# Patient Record
Sex: Male | Born: 1990 | Race: Black or African American | Hispanic: No | Marital: Single | Smoking: Never smoker
Health system: Southern US, Community
[De-identification: ages and names within clinical notes are randomized; demographics above are authoritative.]

---
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
## 2014-01-08 ENCOUNTER — Other Ambulatory Visit: Payer: Self-pay | Admitting: Emergency Medicine

## 2014-01-08 ENCOUNTER — Ambulatory Visit
Admission: RE | Admit: 2014-01-08 | Discharge: 2014-01-08 | Disposition: A | Discharge status: Home / Self Care | Payer: 59 | Source: Ambulatory Visit | Attending: Emergency Medicine | Admitting: Emergency Medicine

## 2014-01-08 DIAGNOSIS — N63 Unspecified lump in unspecified breast: Secondary | ICD-10-CM

## 2016-02-21 ENCOUNTER — Encounter (HOSPITAL_COMMUNITY): Payer: Self-pay | Admitting: *Deleted

## 2016-02-21 ENCOUNTER — Emergency Department (HOSPITAL_COMMUNITY)
Admission: EM | Admit: 2016-02-21 | Discharge: 2016-02-21 | Disposition: A | Discharge status: Home / Self Care | Payer: 59 | Attending: Emergency Medicine | Admitting: Emergency Medicine

## 2016-02-21 ENCOUNTER — Emergency Department (HOSPITAL_COMMUNITY): Payer: 59

## 2016-02-21 DIAGNOSIS — R05 Cough: Secondary | ICD-10-CM | POA: Diagnosis present

## 2016-02-21 DIAGNOSIS — R042 Hemoptysis: Secondary | ICD-10-CM | POA: Insufficient documentation

## 2016-02-21 NOTE — ED Notes (Signed)
Declined W/C at D/C and was escorted to lobby by RN. 

## 2016-02-21 NOTE — ED Triage Notes (Signed)
PT reports when he coughs in the AM there is blood tinged mucous and when he blows his nose  There is blood tinged mucous.

## 2016-02-21 NOTE — Discharge Instructions (Signed)
Drink plenty of fluids You can use nasal saline drops to keep nose moist If symptoms are getting worse please return to ER

## 2016-02-21 NOTE — ED Provider Notes (Signed)
[facility]-EMERGENCY DEPT Provider Note   CSN: [id number] Arrival date & time: 02/21/16  1057  By signing my name below, I, Matthew Nielsen, attest that this documentation has been prepared under the direction and in the presence of Terance HartKelly Kashten Gowin, PA-C. Electronically Signed: Javier Dockerobert Ryan Halas, ER Scribe. 10/23/2015. 12:36 PM.  History   Chief Complaint Chief Complaint  Patient presents with  . Cough   HPI ucous  HPI Comments: Elveria RoyalsBobby Nielsen is a 25 y.o. male who presents to the Emergency Department complaining of clots of blood in his mucous when he coughs or blows his nose. He had several days of productive cough and congestion which has since resolved. He denies epistaxis. He does not smoke. He has only taken allergy pills since onset of his sx. He does not have a known bleeding disorder and is not on any blood thinners. Denies fever, chills, chest pain, URI symptoms, SOB, abdominal pain, N/V.   History reviewed. No pertinent past medical history.  There are no active problems to display for this patient.   History reviewed. No pertinent surgical history.    Home Medications    Prior to Admission medications   Not on File    Family History History reviewed. No pertinent family history.  Social History Social History  Substance Use Topics  . Smoking status: Never Smoker  . Smokeless tobacco: Never Used  . Alcohol use No     Allergies   Patient has no known allergies.   Review of Systems Review of Systems  Constitutional: Negative for chills and fever.  HENT: Negative for congestion, nosebleeds and rhinorrhea.   Respiratory: Positive for cough. Negative for shortness of breath and wheezing.   Cardiovascular: Negative for chest pain.  Gastrointestinal: Negative for nausea and vomiting.  All other systems reviewed and are negative.    Physical Exam Updated Vital Signs There were no vitals taken for this visit.  Physical Exam  Constitutional: He is oriented to  person, place, and time. He appears well-developed and well-nourished. No distress.  HENT:  Head: Normocephalic and atraumatic.  Right Ear: Hearing, tympanic membrane, external ear and ear canal normal.  Left Ear: Hearing, tympanic membrane, external ear and ear canal normal.  Nose: No epistaxis.  Mouth/Throat: Uvula is midline, oropharynx is clear and moist and mucous membranes are normal.  Eyes: Pupils are equal, round, and reactive to light.  Neck: Neck supple.  Cardiovascular: Normal rate and regular rhythm.  Exam reveals no gallop and no friction rub.   No murmur heard. Pulmonary/Chest: Effort normal and breath sounds normal. No respiratory distress. He has no wheezes. He has no rales. He exhibits no tenderness.  Musculoskeletal: Normal range of motion.  Neurological: He is alert and oriented to person, place, and time. Coordination normal.  Skin: Skin is warm and dry. He is not diaphoretic.  Psychiatric: He has a normal mood and affect. His behavior is normal.  Nursing note and vitals reviewed.    ED Treatments / Results  Labs (all labs ordered are listed, but only abnormal results are displayed) Labs Reviewed - No data to display  EKG  EKG Interpretation None       Radiology No results found.  Procedures Procedures (including critical care time)  Medications Ordered in ED Medications - No data to display   Initial Impression / Assessment and Plan / ED Course  I have reviewed the triage vital signs and the nursing notes.  Pertinent labs & imaging results that were available during  my care of the patient were reviewed by me and considered in my medical decision making (see chart for details).  Clinical Course    Patient has blood tinged mucous most likely due to prolonged coughing and blowing his nose. CXR negative. No evidence of bleeding on exam. Advised nasal saline drops and OTC cough medicine. Discussed return precautions.  Pt is hemodynamically stable & in  NAD prior to discharge.   Final Clinical Impressions(s) / ED Diagnoses   Final diagnoses:  Cough with hemoptysis    New Prescriptions There are no discharge medications for this patient.   I personally performed the services described in this documentation, which was scribed in my presence. The recorded information has been reviewed and is accurate.         Bethel BornKelly Marie Alechia Lezama, PA-C 02/26/16 [id number]    Loren Raceravid Yelverton, MD 02/26/16 [phone]

## 2017-07-30 IMAGING — DX DG CHEST 2V
2 series · 2 of 2 positions shown · non-contrast
Comparison: None.

CLINICAL DATA: Hemoptysis for 2 days.

EXAM:
CHEST  2 VIEW

[w chest pa]
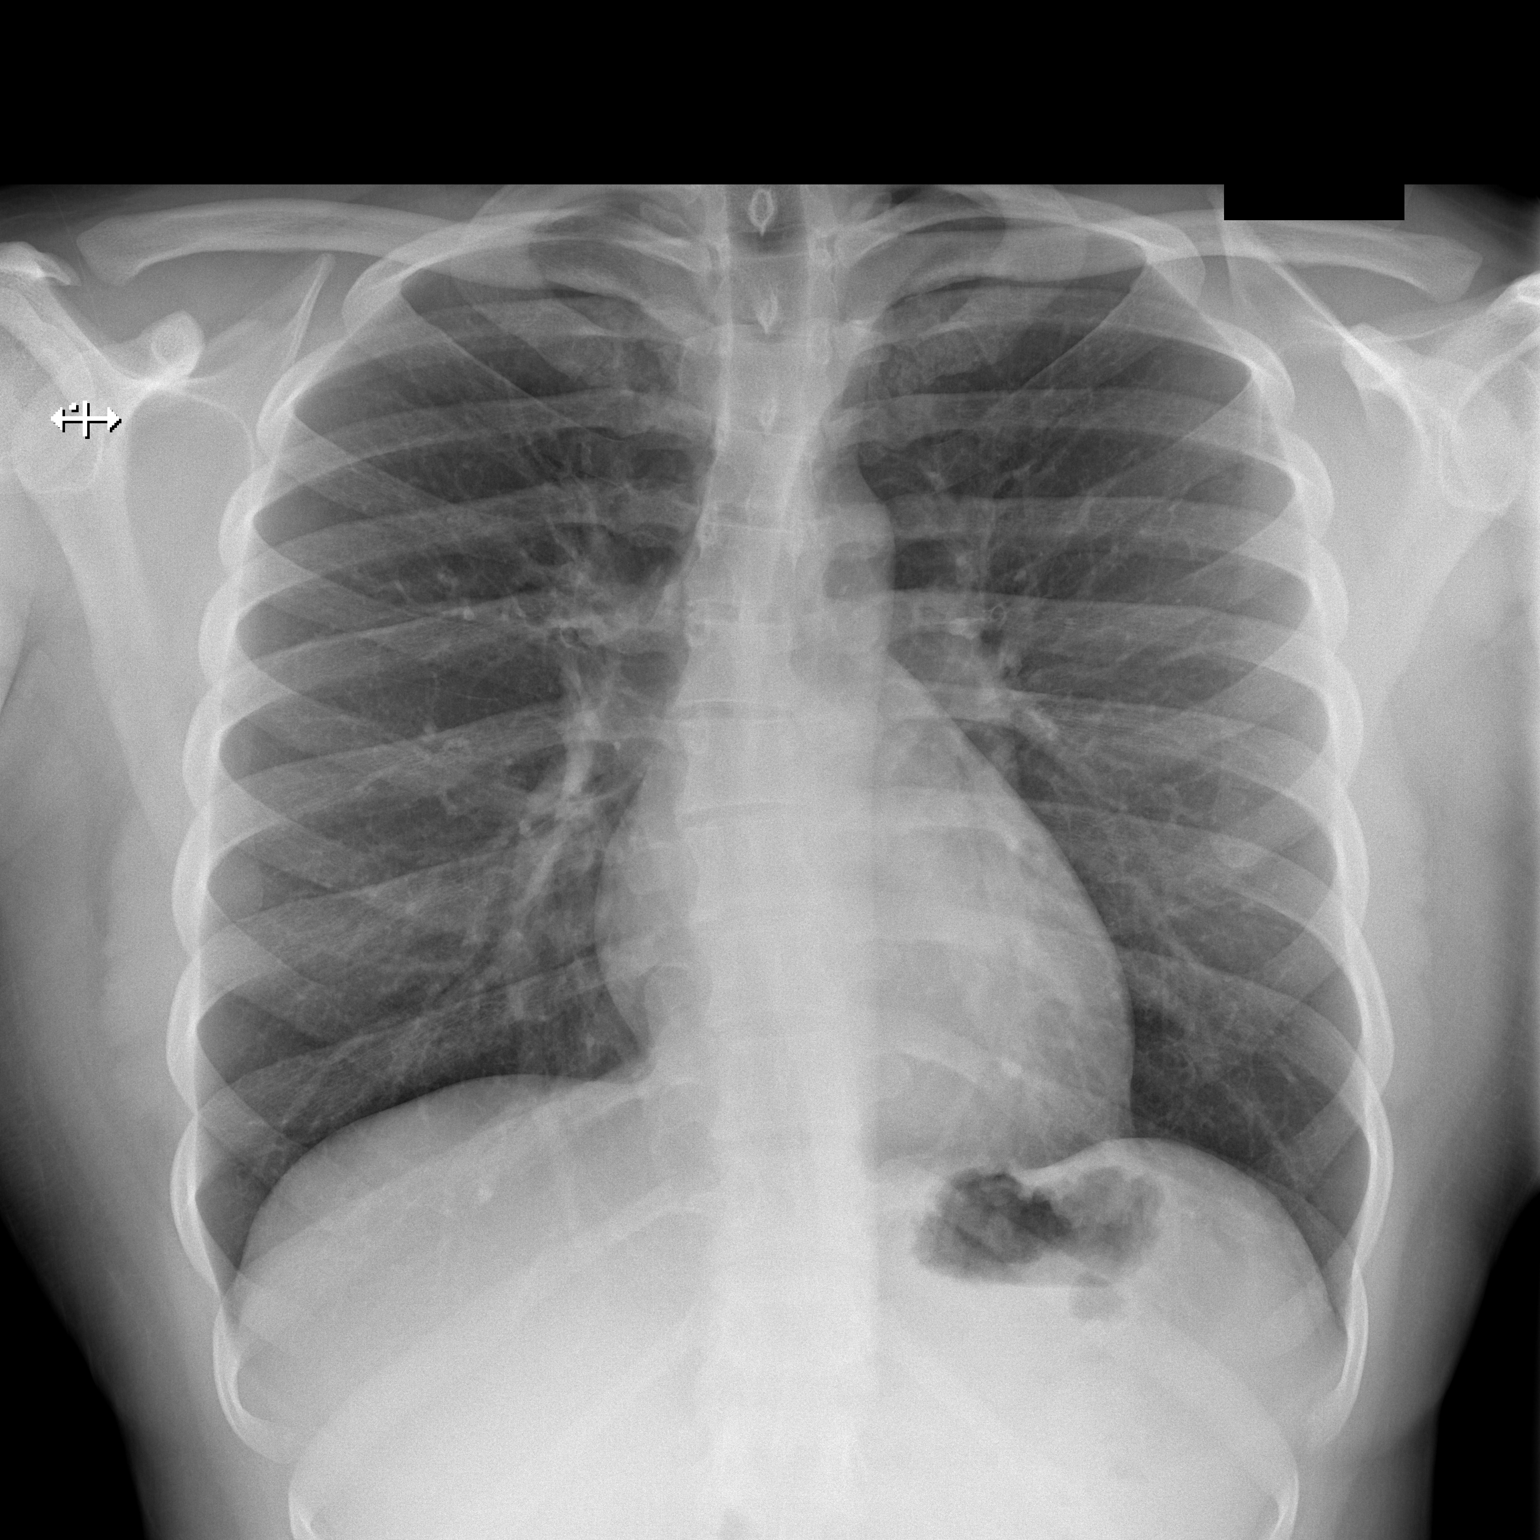

[w chest lat]
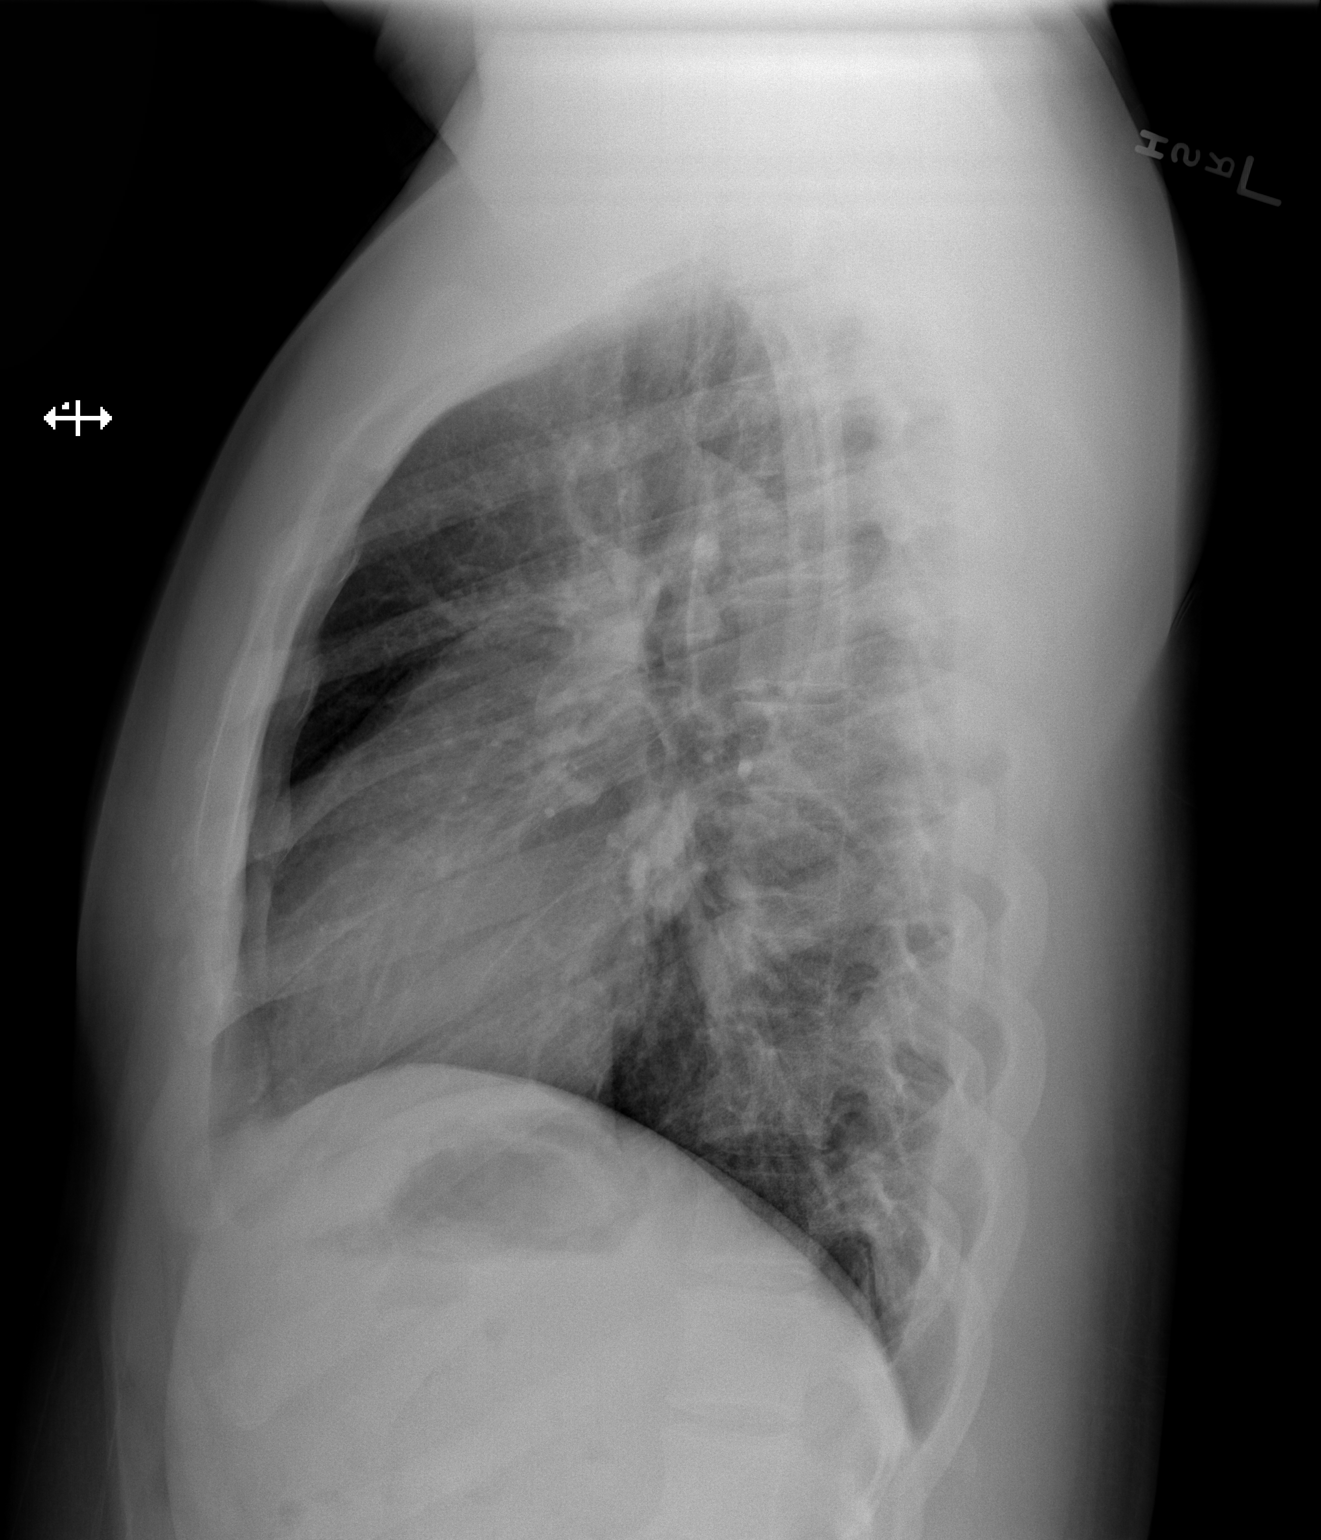

[2 of 2 positions shown; findings below may reference images not displayed]

FINDINGS: Lungs clear. Heart size normal. No pneumothorax or pleural effusion.
Convex right thoracic scoliosis noted.
IMPRESSION: No acute disease.
